# Patient Record
Sex: Female | Born: 2000 | Race: White | Hispanic: No | Marital: Single | Smoking: Never smoker
Health system: Southern US, Community
[De-identification: ages and names within clinical notes are randomized; demographics above are authoritative.]

## PROBLEM LIST (undated history)

## (undated) DIAGNOSIS — J329 Chronic sinusitis, unspecified: Secondary | ICD-10-CM

## (undated) DIAGNOSIS — J069 Acute upper respiratory infection, unspecified: Secondary | ICD-10-CM

## (undated) DIAGNOSIS — R000 Tachycardia, unspecified: Secondary | ICD-10-CM

## (undated) HISTORY — DX: Chronic sinusitis, unspecified: J32.9

## (undated) HISTORY — DX: Tachycardia, unspecified: R00.0

## (undated) HISTORY — PX: TONSILLECTOMY: SUR1361

## (undated) HISTORY — DX: Acute upper respiratory infection, unspecified: J06.9

---
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
## 2018-10-06 ENCOUNTER — Other Ambulatory Visit: Payer: Self-pay

## 2018-10-06 DIAGNOSIS — Z20822 Contact with and (suspected) exposure to covid-19: Secondary | ICD-10-CM

## 2018-10-08 LAB — NOVEL CORONAVIRUS, NAA: SARS-CoV-2, NAA: DETECTED — AB

## 2018-10-18 ENCOUNTER — Other Ambulatory Visit: Payer: Self-pay | Admitting: *Deleted

## 2018-10-18 DIAGNOSIS — Z20822 Contact with and (suspected) exposure to covid-19: Secondary | ICD-10-CM

## 2018-10-20 LAB — NOVEL CORONAVIRUS, NAA: SARS-CoV-2, NAA: NOT DETECTED

## 2019-02-26 ENCOUNTER — Other Ambulatory Visit: Payer: Self-pay

## 2019-02-26 ENCOUNTER — Ambulatory Visit: Payer: BLUE CROSS/BLUE SHIELD | Attending: Internal Medicine

## 2019-02-28 ENCOUNTER — Ambulatory Visit: Payer: BLUE CROSS/BLUE SHIELD | Attending: Internal Medicine

## 2019-02-28 DIAGNOSIS — Z20822 Contact with and (suspected) exposure to covid-19: Secondary | ICD-10-CM

## 2019-03-02 LAB — NOVEL CORONAVIRUS, NAA: SARS-CoV-2, NAA: NOT DETECTED

## 2021-06-02 ENCOUNTER — Telehealth: Payer: Self-pay

## 2021-06-02 NOTE — Telephone Encounter (Signed)
NOTES SCANNED TO REFERRAL 

## 2021-06-13 NOTE — Progress Notes (Deleted)
?Cardiology Office Note:   ? ?Date:  06/17/2021  ? ?ID:  Nandika Cordle, DOB [birth year], MRN [id number] ? ?PCP:  Eartha Inch, MD ?  ?[facility] Providers ?Cardiologist:  None { ? ?Referring MD: Julien Girt, PA*  ? ? ?History of Present Illness:   ? ?Jasmine Mckee is a 21 y.o. female with no significant past medical history who was referred by Julien Girt for further evaluation of symptomatic tachycardia. ? ?Today, *** ? ?Past Medical History:  ?Diagnosis Date  ? Sinusitis   ? Tachycardia   ? URI (upper respiratory infection)   ? ? ?*** The histories are not reviewed yet. Please review them in the "History" navigator section and refresh this SmartLink. ? ?Current Medications: ?Current Meds  ?Medication Sig  ? Norgestimate-Eth Estradiol (SPRINTEC 28 PO) Take by mouth.  ?  ? ?Allergies:   Patient has no known allergies.  ? ?Social History  ? ?Socioeconomic History  ? Marital status: Single  ?  Spouse name: Not on file  ? Number of children: Not on file  ? Years of education: Not on file  ? Highest education level: Not on file  ?Occupational History  ? Not on file  ?Tobacco Use  ? Smoking status: Never  ? Smokeless tobacco: Not on file  ?Substance and Sexual Activity  ? Alcohol use: Yes  ? Drug use: Not on file  ? Sexual activity: Not on file  ?Other Topics Concern  ? Not on file  ?Social History Narrative  ? Not on file  ? ?Social Determinants of Health  ? ?Financial Resource Strain: Not on file  ?Food Insecurity: Not on file  ?Transportation Needs: Not on file  ?Physical Activity: Not on file  ?Stress: Not on file  ?Social Connections: Not on file  ?  ? ?Family History: ?The patient's ***family history includes Clotting disorder in her maternal grandmother; High Cholesterol in her maternal grandmother and mother. ? ?ROS:   ?Please see the history of present illness.    ?*** All other systems reviewed and are negative. ? ?EKGs/Labs/Other Studies Reviewed:   ? ?The following studies were reviewed  today: ?*** ? ?EKG:  EKG is *** ordered today.  The ekg ordered today demonstrates *** ? ?Recent Labs: ?No results found for requested labs within last 8760 hours.  ?Recent Lipid Panel ?No results found for: CHOL, TRIG, HDL, CHOLHDL, VLDL, LDLCALC, LDLDIRECT ? ? ?Risk Assessment/Calculations:   ?{Does this patient have ATRIAL FIBRILLATION?:[phone]} ? ?    ? ?Physical Exam:   ? ?VS:  BP 118/78   Pulse 87   Ht 5\' 5"  (1.651 m)   Wt 151 lb 3.2 oz (68.6 kg)   SpO2 96%   BMI 25.16 kg/m?    ? ?Wt Readings from Last 3 Encounters:  ?06/17/21 151 lb 3.2 oz (68.6 kg)  ?  ? ?GEN: *** Well nourished, well developed in no acute distress ?HEENT: Normal ?NECK: No JVD; No carotid bruits ?LYMPHATICS: No lymphadenopathy ?CARDIAC: ***RRR, no murmurs, rubs, gallops ?RESPIRATORY:  Clear to auscultation without rales, wheezing or rhonchi  ?ABDOMEN: Soft, non-tender, non-distended ?MUSCULOSKELETAL:  No edema; No deformity  ?SKIN: Warm and dry ?NEUROLOGIC:  Alert and oriented x 3 ?PSYCHIATRIC:  Normal affect  ? ?ASSESSMENT:   ? ?No diagnosis found. ?PLAN:   ? ?In order of problems listed above: ? ?#Symptomatic Sinus Tachycardia: ?-Check zio monitor ?-Increase hydration ?-Cut back on caffeine ?-Check TSH ?-??? supplements ? ?   ? ?{Are you ordering a CV Procedure (  e.g. stress test, cath, DCCV, TEE, etc)?   Press F2        :[id number]}  ? ? ?Medication Adjustments/Labs and Tests Ordered: ?Current medicines are reviewed at length with the patient today.  Concerns regarding medicines are outlined above.  ?No orders of the defined types were placed in this encounter. ? ?No orders of the defined types were placed in this encounter. ? ? ?There are no Patient Instructions on file for this visit.  ? ?Signed, ?Meriam Sprague, MD  ?06/17/2021 3:10 PM    ?[facility] ?

## 2021-06-17 ENCOUNTER — Ambulatory Visit: Payer: BC Managed Care – PPO | Admitting: Cardiology

## 2021-06-17 ENCOUNTER — Encounter: Payer: Self-pay | Admitting: Cardiology

## 2021-06-17 VITALS — BP 118/78 | HR 87 | Ht 65.0 in | Wt 151.2 lb

## 2021-06-17 DIAGNOSIS — R002 Palpitations: Secondary | ICD-10-CM | POA: Diagnosis not present

## 2021-06-17 NOTE — Progress Notes (Signed)
?Cardiology Office Note:   ? ?Date:  06/17/2021  ? ?ID:  Jasmine Mckee, DOB 09/01/2000, MRN [id number] ? ?PCP:  Jasmine Inch, MD ?  ?[facility] Providers ?Cardiologist:  None { ? ?Referring MD: Jasmine Girt, PA*  ? ? ?History of Present Illness:   ? ?Jasmine Mckee is a 21 y.o. female with no significant past medical history who was referred by Jasmine Mckee for further evaluation of symptomatic tachycardia. ? ?Referral notes from [place], [place] reviewed. At her visit on 05/29/2021 she was noted to have a longstanding history of intermittent tachycardia. She previously wore a Holter monitor in high school and in college; she was told both monitors were normal. She reported that her tachycardia was becoming more symptomatic. She had recently participated in a 10k run and her HR was 193 bpm on average. Also she woke up with right-sided facial edema and new onset congestion. She had no dental pain. Had labs at that time with normal TSH and hemoglobin. ? ?Today, the patient states that she has been having intermittent tachycardia (average of 1 episode per day that lasts about 5 minutes) with shortness of breath and palpitations for the last several years. The episodes are not always associated with activity and may occur at rest, has been triggered by drinking alcohol as well. Does have easy fatigability with exercise due to the persistent tachycardia, she has never been able to run for sustained periods. Her heart rate during her 10K a few weeks ago was 193, when reviewing her smart watch data, her heart rate at the end of the race was 188bpm and was 207bpm 5 minutes later. It took her 45 minutes to recover after this run due to shortness of breath and her heart rate.   ?Denies any excessive caffeine use (only has about 3 diet Coke's per week). Denies OTC supplements, tobacco use, drug use. ?Only medication is Sprintec, which she has been on for the last year. She was previously on it several years ago  and then switched to an IUD for 2 years. The symptoms did not change during that time period.  ?Family cardiovascular disease with maternal grandmother requiring stents in her 73s and 42s. Mother and maternal grandmother have hyperlipidemia.  ? ? ?She denies any , chest pain, or peripheral edema. No lightheadedness, headaches, syncope, orthopnea, or PND. ? ? ?Past Medical History:  ?Diagnosis Date  ? Sinusitis   ? Tachycardia   ? URI (upper respiratory infection)   ? ? ? ? ?Current Medications: ?Current Meds  ?Medication Sig  ? Norgestimate-Eth Estradiol (SPRINTEC 28 PO) Take by mouth.  ?  ? ?Allergies:   Patient has no known allergies.  ? ?Social History  ? ?Socioeconomic History  ? Marital status: Single  ?  Spouse name: Not on file  ? Number of children: Not on file  ? Years of education: Not on file  ? Highest education level: Not on file  ?Occupational History  ? Not on file  ?Tobacco Use  ? Smoking status: Never  ? Smokeless tobacco: Not on file  ?Substance and Sexual Activity  ? Alcohol use: Yes  ? Drug use: Not on file  ? Sexual activity: Not on file  ?Other Topics Concern  ? Not on file  ?Social History Narrative  ? Not on file  ? ?Social Determinants of Health  ? ?Financial Resource Strain: Not on file  ?Food Insecurity: Not on file  ?Transportation Needs: Not on file  ?Physical Activity: Not on  file  ?Stress: Not on file  ?Social Connections: Not on file  ?  ? ?Family History: ?The patient's family history includes Clotting disorder in her maternal grandmother; High Cholesterol in her maternal grandmother and mother. ? ?ROS:   ?Review of Systems  ?Constitutional:  Negative for chills and fever.  ?HENT:  Negative for hearing loss and nosebleeds.   ?Eyes:  Negative for pain.  ?Respiratory:  Positive for shortness of breath. Negative for sputum production.   ?Cardiovascular:  Positive for palpitations. Negative for chest pain, orthopnea, claudication, leg swelling and PND.  ?Gastrointestinal:  Negative for  constipation and diarrhea.  ?Genitourinary:  Negative for dysuria.  ?Musculoskeletal:  Negative for joint pain.  ?Neurological:  Negative for seizures and loss of consciousness.  ?Endo/Heme/Allergies:  Does not bruise/bleed easily.  ?Psychiatric/Behavioral:  Negative for depression and substance abuse.   ? ? ?EKGs/Labs/Other Studies Reviewed:   ? ?The following studies were reviewed today: ? ?Pediatric TTE 04/12/2019 ([facility]): ?INTERPRETATION SUMMARY  ?  No cardiac disease identified.  ? ?CARDIAC POSITION  ?  Levocardia. Abdominal situs solitus. Atrial situs solitus. D Ventricular Loop. S Normal  ?  position great vessels.  ? ?VEINS  ?  Normal systemic venous connections. At least three pulmonary veins are confirmed  ?  connecting into the left atrium. Normal pulmonary vein velocity.  ? ?ATRIA  ?  Normal right atrial size. Normal left atrial size. No atrial level communication seen.  ? ?ATRIOVENTRICULAR VALVES  ?  Normal tricuspid valve. No tricuspid valve stenosis. Mild tricuspid valve regurgitation.  ?  The TR peak gradient is at least 22 mmHg, but is obtained from an incomplete tracing.  ?  Normal mitral valve. No mitral valve stenosis. Trace mitral valve regurgitation.  ? ?VENTRICLES  ?  Normal right ventricle structure and size. Normal left ventricle structure and size.  ?  Intact ventricular septum.  ? ?CARDIAC FUNCTION  ?  Normal right ventricular systolic function. Normal left ventricular systolic function.  ? ?SEMILUNAR VALVES  ?  Normal pulmonic valve. Trivial pulmonary valve insufficiency. Normal pulmonic valve  ?  velocity. Aortic valve mobility appears normal. Tricommissural aortic valve. Normal  ?  aortic valve velocity by Doppler. No aortic valve insufficiency by color Doppler.  ? ?CORONARY ARTERIES  ?  The right coronary artery appears to arise normally by two-dimensional imaging, but is  ?  not confirmed by color Doppler. Normal origin and proximal course of the left coronary  ?  artery with prograde  flow demonstrated by color Doppler.  ? ?GREAT ARTERIES  ?  Left aortic arch with normal branching pattern. No evidence of coarctation of the aorta.  ?  No aortic root dilation. There is normal pulsatility of the abdominal aorta. Normal main  ?  pulmonary artery and pulmonary artery branches.  ? ?SHUNTS  ?  No patent ductus arteriosus.  ? ?EXTRACARDIAC  ?  No pericardial effusion. There is no pleural effusion.  ? ? ?EKG:  EKG is personally reviewed. ?06/17/2021: Sinus rhythm. Rate 87 bpm. ? ? ?Recent Labs: ?No results found for requested labs within last 8760 hours.  ? ?Recent Lipid Panel ?No results found for: CHOL, TRIG, HDL, CHOLHDL, VLDL, LDLCALC, LDLDIRECT ? ? ?Risk Assessment/Calculations:   ?  ? ?    ? ?Physical Exam:   ? ?VS:  BP 118/78   Pulse 87   Ht 5\' 5"  (1.651 m)   Wt 151 lb 3.2 oz (68.6 kg)   SpO2 96%   BMI  25.16 kg/m?    ? ?Wt Readings from Last 3 Encounters:  ?06/17/21 151 lb 3.2 oz (68.6 kg)  ?  ? ?GEN: Well nourished, well developed in no acute distress ?HEENT: Normal ?NECK: No JVD; No carotid bruits ?CARDIAC: RRR, no murmurs, rubs, gallops ?RESPIRATORY:  Clear to auscultation without rales, wheezing or rhonchi  ?ABDOMEN: Soft, non-tender, non-distended ?MUSCULOSKELETAL:  No edema; No deformity  ?SKIN: Warm and dry ?NEUROLOGIC:  Alert and oriented x 3 ?PSYCHIATRIC:  Normal affect  ? ?ASSESSMENT:   ? ?1. Palpitations   ? ?PLAN:   ? ?In order of problems listed above: ? ?#Symptomatic Sinus Tachycardia: ?Thyroid and hemoglobin levels recently normal. Consideration for inappropriate sinus tachycardia. Given significant symptoms with exercise, will obtain an exercise treadmill test to evaluate and monitor HR. Unlikely an obstructive etiology given age and lack of risk factors. Anxiety does not appear to be a contribution to the symptoms. ?-Exercise treadmill test ?-Declined repeat monitor as has had multiple in the past ?-Can consider BB pending GXT findings ?-Increase hydration ?-Closely monitor HR  trend with apple watch ? ?   ? ?Shared Decision Making/Informed Consent{ ? ?The risks [chest pain, shortness of breath, cardiac arrhythmias, dizziness, blood pressure fluctuations, myocardial infarction, strok

## 2021-06-17 NOTE — Patient Instructions (Signed)
Medication Instructions:  ? ?Your physician recommends that you continue on your current medications as directed. Please refer to the Current Medication list given to you today. ? ?*If you need a refill on your cardiac medications before your next appointment, please call your pharmacy* ? ? ?Testing/Procedures: ? ?Your physician has requested that you have an exercise tolerance test. For further information please visit [email or web address] Please also follow instruction sheet, as given. ? ? ?Follow-Up: ? ?3 MONTHS WITH AN EXTENDER IN THE OFFICE ? ? ?Important Information About Sugar ? ? ? ? ? ? ?

## 2021-07-02 ENCOUNTER — Ambulatory Visit (INDEPENDENT_AMBULATORY_CARE_PROVIDER_SITE_OTHER): Payer: BC Managed Care – PPO

## 2021-07-02 ENCOUNTER — Telehealth: Payer: Self-pay | Admitting: *Deleted

## 2021-07-02 ENCOUNTER — Telehealth: Payer: Self-pay | Admitting: Cardiology

## 2021-07-02 DIAGNOSIS — R002 Palpitations: Secondary | ICD-10-CM

## 2021-07-02 LAB — EXERCISE TOLERANCE TEST
Angina Index: 0
Duke Treadmill Score: 10
Estimated workload: 12.2
Exercise duration (min): 10 min
Exercise duration (sec): 13 s
MPHR: 198 {beats}/min
Peak HR: 196 {beats}/min
Percent HR: 98 %
RPE: 17
Rest HR: 96 {beats}/min
ST Depression (mm): 0 mm

## 2021-07-02 MED ORDER — METOPROLOL TARTRATE 25 MG PO TABS: 12.5000 mg | ORAL_TABLET | Freq: Two times a day (BID) | ORAL | 2 refills | Status: DC

## 2021-07-02 NOTE — Telephone Encounter (Signed)
Pt just left the office from her GXT appt.  Dr. Shari Prows ordered this on the pt at the last OV for inappropriate sinus tach.   Pt stopped by checkout on the way out of the office and asked for them to send Dr. Shari Prows and myself a message about her starting a beta blocker as discussed at last OV.   Pt states she is asymptomatic at this time, but still infrequently experiences palpitations.  Pt states she held off on starting a BB because she wanted to consult this with her parents first and let her mom do more research.  Also pt stated her Mom preferred less meds as possible at the time pt was seen at the office.  Pt states with great consideration, both her and her parents agree that she should start a BB as discussed.   Informed the pt that Dr. Shari Prows wanted to advise on starting a BB once she had her GXT completed.  Pt voiced the majority of her complaints did occur when she exercised in the past.  Pt is a runner. Endorsed to the pt that Dr. Shari Prows was going to consider starting her on a BB pending GXT findings, being pt had multiple monitors in the past and refused to have another, when offered at last OV.  Did advise the pt to increase her po hydration.  Did advise her to continue monitoring HR trends on her smart watch.  Informed the pt that once Dr. Shari Prows has her GXT results back, she will advise on this and we will follow-up with her accordingly thereafter with further recommendations, and if a BB needs to be started at that time.   Will route this message to Dr. Shari Prows to further refer to and advise, when GXT report comes back. Pt verbalized understanding and agrees with this plan.

## 2021-07-02 NOTE — Telephone Encounter (Signed)
Patient came in today for GXT and stated she wanted to let Dr. Shari Prows know that she would like to start the beta blocker they talked about on the previous visit.(06/17/21) Please give her a call to discuss at [phone].

## 2021-07-02 NOTE — Telephone Encounter (Signed)
See previous open telephone encounter regarding this result.

## 2021-07-02 NOTE — Telephone Encounter (Signed)
-----   Message from Jasmine Sprague, MD sent at 07/02/2021  4:13 PM EDT ----- Her exercise stress test shows that she is in normal rhythm throughout her test. She does go very fast up to 190 but she is in rhythm. During recovery, her heart rate slows down to 120 before the test was over. Overall, this is good news because she has no evidence of arrhythmias. If she was having symptoms during testing, however, she may benefit from a little beta blocker to help prevent her from jumping up so high with exertion like we discussed during her visit. Again, this would be to make her feel better as there is nothing harmful seen on this test. Let us know what she would like to do.

## 2021-07-02 NOTE — Addendum Note (Signed)
Addended by: Loa Socks on: 07/02/2021 05:16 PM   Modules accepted: Orders

## 2021-07-02 NOTE — Telephone Encounter (Signed)
Attempted to call the pt back to endorse GXT results and recommendations per Dr. Shari Prows, and pt did not answer and voicemail is full at this time.

## 2021-07-02 NOTE — Telephone Encounter (Signed)
The patient has been notified of the result and verbalized understanding.  All questions (if any) were answered.  Pt aware that per Dr. Shari Prows, we will start her on metoprolol tartrate 12.5 mg po bid, and she should take her first dose at night and see how she feels in the morning.  She is aware this is a short acting med so if she does not feel well on it, it washes out of the system within 8-12 hrs.  She is aware that if she tolerates the medication, then we can change her to the long-acting form which is only taken once a day.  Confirmed the pharmacy of choice with the pt. Pt verbalized understanding and agrees with this plan.

## 2021-07-02 NOTE — Telephone Encounter (Signed)
Jasmine Sprague, MD  Loa Socks, LPN Her exercise stress test shows that she is in normal rhythm throughout her test. She does go very fast up to 190 but she is in rhythm. During recovery, her heart rate slows down to 120 before the test was over. Overall, this is good news because she has no evidence of arrhythmias. If she was having symptoms during testing, however, she may benefit from a little beta blocker to help prevent her from jumping up so high with exertion like we discussed during her visit. Again, this would be to make her feel better as there is nothing harmful seen on this test. Let us know what she would like to do.

## 2021-07-02 NOTE — Telephone Encounter (Signed)
I sent a result message about this too so she read my mind.  Let's trial low dose metoprolol tartrate 12.5mg  BID. Have her take her first dose at night and see how she feels in the morning. This is a short acting med so if she does not feel well on it, it washes out within 8-12 hours. If she tolerates the medication, then we can change her to the long-acting form which is a once a day medication.

## 2021-07-19 ENCOUNTER — Encounter: Payer: Self-pay | Admitting: Cardiology

## 2021-07-20 MED ORDER — METOPROLOL TARTRATE 25 MG PO TABS: 25.0000 mg | ORAL_TABLET | Freq: Two times a day (BID) | ORAL | 1 refills | Status: DC

## 2021-08-25 ENCOUNTER — Encounter: Payer: Self-pay | Admitting: Cardiology

## 2021-08-25 MED ORDER — METOPROLOL TARTRATE 25 MG PO TABS: 25.0000 mg | ORAL_TABLET | Freq: Two times a day (BID) | ORAL | 1 refills | Status: DC

## 2021-09-17 ENCOUNTER — Ambulatory Visit: Payer: BC Managed Care – PPO | Admitting: Nurse Practitioner

## 2021-09-17 NOTE — Progress Notes (Deleted)
Cardiology Office Note:    Date:  09/17/2021   ID:  Jasmine Mckee, DOB 11-21-2000, MRN [id number]  PCP:  Eartha Inch, MD   [facility] Providers Cardiologist:  None { Click to update primary MD,subspecialty MD or APP then REFRESH:1}    Referring MD: Eartha Inch, MD   Chief Complaint: ***  History of Present Illness:    Jasmine Mckee is a *** 21 y.o. female with a hx of ***  Referred for symptomatic tachycardia and seen by Dr. Shari Prows on 06/17/2021  Past Medical History:  Diagnosis Date   Sinusitis    Tachycardia    URI (upper respiratory infection)     *** The histories are not reviewed yet. Please review them in the "History" navigator section and refresh this SmartLink.  Current Medications: No outpatient medications have been marked as taking for the 09/17/21 encounter (Appointment) with Lissa Hoard, Zachary George, NP.     Allergies:   Patient has no known allergies.   Social History   Socioeconomic History   Marital status: Single    Spouse name: Not on file   Number of children: Not on file   Years of education: Not on file   Highest education level: Not on file  Occupational History   Not on file  Tobacco Use   Smoking status: Never   Smokeless tobacco: Not on file  Substance and Sexual Activity   Alcohol use: Yes   Drug use: Not on file   Sexual activity: Not on file  Other Topics Concern   Not on file  Social History Narrative   Not on file   Social Determinants of Health   Financial Resource Strain: Not on file  Food Insecurity: Not on file  Transportation Needs: Not on file  Physical Activity: Not on file  Stress: Not on file  Social Connections: Not on file     Family History: The patient's ***family history includes Clotting disorder in her maternal grandmother; High Cholesterol in her maternal grandmother and mother.  ROS:   Please see the history of present illness.    *** All other systems reviewed and are negative.  Labs/Other  Studies Reviewed:    The following studies were reviewed today: ***  Recent Labs: No results found for requested labs within last 365 days.  Recent Lipid Panel No results found for: "CHOL", "TRIG", "HDL", "CHOLHDL", "VLDL", "LDLCALC", "LDLDIRECT"   Risk Assessment/Calculations:   {Does this patient have ATRIAL FIBRILLATION?:[phone]}       Physical Exam:    VS:  There were no vitals taken for this visit.    Wt Readings from Last 3 Encounters:  06/17/21 151 lb 3.2 oz (68.6 kg)     GEN: *** Well nourished, well developed in no acute distress HEENT: Normal NECK: No JVD; No carotid bruits CARDIAC: ***RRR, no murmurs, rubs, gallops RESPIRATORY:  Clear to auscultation without rales, wheezing or rhonchi  ABDOMEN: Soft, non-tender, non-distended MUSCULOSKELETAL:  No edema; No deformity. *** pedal pulses, ***bilaterally SKIN: Warm and dry NEUROLOGIC:  Alert and oriented x 3 PSYCHIATRIC:  Normal affect   EKG:  EKG is *** ordered today.  The ekg ordered today demonstrates ***  Diagnoses:    No diagnosis found. Assessment and Plan:       {Are you ordering a CV Procedure (e.g. stress test, cath, DCCV, TEE, etc)?   Press F2        :[id number]}    Medication Adjustments/Labs and Tests Ordered: Current medicines are reviewed  at length with the patient today.  Concerns regarding medicines are outlined above.  No orders of the defined types were placed in this encounter.  No orders of the defined types were placed in this encounter.   There are no Patient Instructions on file for this visit.   Signed, Levi Aland, NP  09/17/2021 6:40 AM    [facility]

## 2021-09-28 NOTE — Progress Notes (Deleted)
Cardiology Office Note:    Date:  09/28/2021   ID:  Jasmine Mckee, DOB 08-21-2000, MRN [id number]  PCP:  Jasmine Inch, MD   [facility] Providers Cardiologist:  None { Click to update primary MD,subspecialty MD or APP then REFRESH:1}    Referring MD: Jasmine Inch, MD   Chief Complaint: ***  History of Present Illness:    Jasmine Mckee is a *** 21 y.o. female with no significant past medical history referred to cardiology by PCP for evaluation of symptomatic tachycardia.   Seen by Dr. Shari Mckee on 06/17/2021 for longstanding history of intermittent tachycardia.  Previously wore a Holter monitor in high school and college and both monitors were normal.  She reported her tachycardia was becoming more symptomatic.  She had recently participated in a 10K run and HR was 193 bpm on average.  Awoke with right-sided facial edema and new onset congestion TSH and hemoglobin were normal. Reported average of 1 episode per day lasting 5 minutes of tachycardia with shortness of breath and palpitations for the last several years.  Not always associated with activity and may occur at rest.  Has been triggered by drinking alcohol as well has easy fatigability with exercise due to persistent tachycardia, has never been able to run for sustained periods.  Following her run HR was 188 bpm at end of race and was 207 bpm 5 minutes later.  It took her 45 minutes to recover.  Denies excessive caffeine use (only has about 3 diet Cokes per week).  Denies OTC supplements, tobacco use, drug use. Has been on oral BC, tried an IUD as well with no improvement in symptoms.   ETT revealed NSR throughout the test, up to 190 bpm. During recovery char slows down to 120 bpm before test completed.  No evidence of arrhythmias.  She was started on metoprolol tartrate 12.5 mg daily for symptom relief.  She continued to have symptoms so dose was increased to 25 mg twice daily.  Today, she is here for follow-up.     Past Medical  History:  Diagnosis Date   Sinusitis    Tachycardia    URI (upper respiratory infection)     *** The histories are not reviewed yet. Please review them in the "History" navigator section and refresh this SmartLink.  Current Medications: No outpatient medications have been marked as taking for the 09/30/21 encounter (Appointment) with Jasmine Mckee, Jasmine George, NP.     Allergies:   Patient has no known allergies.   Social History   Socioeconomic History   Marital status: Single    Spouse name: Not on file   Number of children: Not on file   Years of education: Not on file   Highest education level: Not on file  Occupational History   Not on file  Tobacco Use   Smoking status: Never   Smokeless tobacco: Not on file  Substance and Sexual Activity   Alcohol use: Yes   Drug use: Not on file   Sexual activity: Not on file  Other Topics Concern   Not on file  Social History Narrative   Not on file   Social Determinants of Health   Financial Resource Strain: Not on file  Food Insecurity: Not on file  Transportation Needs: Not on file  Physical Activity: Not on file  Stress: Not on file  Social Connections: Not on file     Family History: The patient's ***family history includes Clotting disorder in her maternal grandmother; High  Cholesterol in her maternal grandmother and mother.  ROS:   Please see the history of present illness.    *** All other systems reviewed and are negative.  Labs/Other Studies Reviewed:    The following studies were reviewed today:  ETT 07/02/21    No ST deviation was noted.   ETT with good exercise tolerance (10:13); no chest pain; normal blood pressure response; no exercise-induced arrhythmias noted; no ST changes; negative adequate exercise treadmill.   Recent Labs: No results found for requested labs within last 365 days.  Recent Lipid Panel No results found for: "CHOL", "TRIG", "HDL", "CHOLHDL", "VLDL", "LDLCALC", "LDLDIRECT"   Risk  Assessment/Calculations:   {Does this patient have ATRIAL FIBRILLATION?[phone]}       Physical Exam:    VS:  There were no vitals taken for this visit.    Wt Readings from Last 3 Encounters:  06/17/21 151 lb 3.2 oz (68.6 kg)     GEN: *** Well nourished, well developed in no acute distress HEENT: Normal NECK: No JVD; No carotid bruits CARDIAC: ***RRR, no murmurs, rubs, gallops RESPIRATORY:  Clear to auscultation without rales, wheezing or rhonchi  ABDOMEN: Soft, non-tender, non-distended MUSCULOSKELETAL:  No edema; No deformity. *** pedal pulses, ***bilaterally SKIN: Warm and dry NEUROLOGIC:  Alert and oriented x 3 PSYCHIATRIC:  Normal affect   EKG:  EKG is *** ordered today.  The ekg ordered today demonstrates ***  Diagnoses:    No diagnosis found. Assessment and Plan:     Palpitations:  {Are you ordering a CV Procedure (e.g. stress test, cath, DCCV, TEE, etc)?   Press F2        :[id number]}    Medication Adjustments/Labs and Tests Ordered: Current medicines are reviewed at length with the patient today.  Concerns regarding medicines are outlined above.  No orders of the defined types were placed in this encounter.  No orders of the defined types were placed in this encounter.   There are no Patient Instructions on file for this visit.   Signed, Levi Aland, NP  09/28/2021 8:10 AM    [facility]

## 2021-09-30 ENCOUNTER — Ambulatory Visit: Payer: BC Managed Care – PPO | Admitting: Nurse Practitioner

## 2021-10-03 ENCOUNTER — Encounter: Payer: Self-pay | Admitting: Cardiology

## 2023-06-22 NOTE — Progress Notes (Unsigned)
 Cardiology Office Note:   Date:  06/23/2023  ID:  Jasmine Mckee, DOB July 18, 2000, MRN [id number] PCP:  Emaline Handsome, MD  [facility] Providers Cardiologist:  Alyssa Backbone, MD Referring MD: Emaline Handsome, MD  Chief Complaint/Reason for Referral:  F/U tachycardia ASSESSMENT:    1. Palpitations   2. Precordial pain     PLAN:   In order of problems listed above: Palpitations: Have resolved after 30 pound weight loss. Chest pain: Seems atypical.  Happens at rest.  She is very active and it does not happen with exertion.  She had a very reassuring exercise treadmill stress test in 2023 achieving 12 METS.  She gives me a history of quite frequent burping and belching.  We will trial a PPI.  I discussed with the patient that we will keep follow-up open-ended for now.  She agrees with this.            Dispo:  Return if symptoms worsen or fail to improve.      Medication Adjustments/Labs and Tests Ordered: Current medicines are reviewed at length with the patient today.  Concerns regarding medicines are outlined above.  The following changes have been made:     Labs/tests ordered: Orders Placed This Encounter  Procedures   EKG 12-Lead    Medication Changes: Meds ordered this encounter  Medications   omeprazole (PRILOSEC) 20 MG capsule    Sig: Take 1 capsule (20 mg total) by mouth daily.    Dispense:  30 capsule    Refill:  0    Can get OTC after this 30 day script    Current medicines are reviewed at length with the patient today.  The patient does not have concerns regarding medicines.  I spent 33 minutes reviewing all clinical data during and prior to this visit including all relevant imaging studies, laboratories, clinical information from other health systems and prior notes from both Cardiology and other specialties, interviewing the patient, conducting a complete physical examination, and coordinating care in order to formulate a comprehensive and personalized  evaluation and treatment plan.   History of Present Illness:      FOCUSED PROBLEM LIST:   Palpitations Multiple previous monitors >> no abnormalies ETT negative 2023 EF normal,  no valve issues TTE 2021 ([facility])  May 2025:  Patient consents to use of AI scribe. The patient returns for follow-up.  She was last seen a few years ago for tachycardia.  She has had an extensive workup with multiple monitors in high school and college which were essentially normal.  Apparently she participated in a 10K run and her average heart rate was 193.  TSH and and CBC were normal during that evaluation.  At that last visit she reported intermittent tachycardia leading to fatigue.  She denied any supplements, excessive caffeine use, tobacco use, or drug use.  She was offered a monitor which she declined due to multiple other monitors being reassuring.  She is referred for an exercise treadmill stress test which again was reassuring.  Due to a highly elevated heart rate during exercise, Lopressor  25 mg twice daily was prescribed.  She has a history of high heart rate and was previously on metoprolol , which she is no longer taking. Her heart rate is currently stable, which she attributes to a 30-pound weight loss. Her mother notes that her heart rate used to reach up to 220 beats per minute during high school, prompting initial cardiology evaluation. No recent episodes of tachycardia have been  reported.  She began experiencing severe chest pain two weeks ago, described as a sensation of someone sitting on her chest. Initially mild, the pain became severe two nights ago while she was sitting on the couch, making it difficult to breathe. Attempts to alleviate the pain by changing positions and pressing on her chest were unsuccessful. The pain subsided on its own but left her feeling 'bruised'. The chest pain has occurred twice, with the first episode occurring a week prior to the most recent one. No chest pain during  exercise, lightheadedness, leg swelling, or breathing difficulties when lying flat. She engages in walking and pushups for exercise.  She reports frequent indigestion and excessive belching with every meal. She is not currently taking any medication for indigestion.        Current Medications: Current Meds  Medication Sig   albuterol (VENTOLIN HFA) 108 (90 Base) MCG/ACT inhaler Inhale 2 puffs into the lungs.   norelgestromin-ethinyl estradiol (XULANE) 150-35 MCG/24HR transdermal patch 1 patch once a week.   omeprazole (PRILOSEC) 20 MG capsule Take 1 capsule (20 mg total) by mouth daily.   sertraline (ZOLOFT) 50 MG tablet Take 1 tablet by mouth daily.   TREMFYA 100 MG/ML pen    tretinoin (RETIN-A) 0.05 % cream Apply topically.     Review of Systems:   Please see the history of present illness.    All other systems reviewed and are negative.     EKGs/Labs/Other Test Reviewed:   EKG: 2023 EKG normal sinus rhythm  EKG Interpretation Date/Time:  Thursday Jun 23 2023 09:01:44 EDT Ventricular Rate:  92 PR Interval:  122 QRS Duration:  68 QT Interval:  346 QTC Calculation: 427 R Axis:   75  Text Interpretation: Normal sinus rhythm Normal ECG No previous ECGs available Confirmed by Alyssa Backbone ([id number]) on 06/23/2023 9:08:31 AM         Risk Assessment/Calculations:          Physical Exam:   VS:  BP 118/78   Pulse 73   Ht 5\' 5"  (1.651 m)   Wt 122 lb 3.2 oz (55.4 kg)   SpO2 94%   BMI 20.34 kg/m        Wt Readings from Last 3 Encounters:  06/23/23 122 lb 3.2 oz (55.4 kg)  06/17/21 151 lb 3.2 oz (68.6 kg)      GENERAL:  No apparent distress, AOx3 HEENT:  No carotid bruits, +2 carotid impulses, no scleral icterus CAR: RRR no murmurs, gallops, rubs, or thrills RES:  Clear to auscultation bilaterally ABD:  Soft, nontender, nondistended, positive bowel sounds x 4 VASC:  +2 radial pulses, +2 carotid pulses NEURO:  CN 2-12 grossly intact; motor and sensory grossly  intact PSYCH:  No active depression or anxiety EXT:  No edema, ecchymosis, or cyanosis  Signed, Aurorah Schlachter K Bryton Romagnoli, MD  06/23/2023 9:26 AM    [facility] [address] [place], [place], [place]  [address] Phone: [phone]; Fax: [phone]   Note:  This document was prepared using Dragon voice recognition software and may include unintentional dictation errors.

## 2023-06-23 ENCOUNTER — Ambulatory Visit: Attending: Internal Medicine | Admitting: Internal Medicine

## 2023-06-23 ENCOUNTER — Encounter: Payer: Self-pay | Admitting: Internal Medicine

## 2023-06-23 VITALS — BP 118/78 | HR 73 | Ht 65.0 in | Wt 122.2 lb

## 2023-06-23 DIAGNOSIS — R072 Precordial pain: Secondary | ICD-10-CM

## 2023-06-23 DIAGNOSIS — R002 Palpitations: Secondary | ICD-10-CM | POA: Diagnosis not present

## 2023-06-23 MED ORDER — OMEPRAZOLE 20 MG PO CPDR: 20.0000 mg | DELAYED_RELEASE_CAPSULE | Freq: Every day | ORAL | 0 refills | Status: AC

## 2023-06-23 NOTE — Patient Instructions (Signed)
 Medication Instructions:  Your physician has recommended you make the following change in your medication:  1.) start Prilosec (omeprazole) 20 mg - one tablet daily - a 30 day prescription has been sent to [facility].  After that, medication can be purchased over the counter.  *If you need a refill on your cardiac medications before your next appointment, please call your pharmacy*  Lab Work: none If you have labs (blood work) drawn today and your tests are completely normal, you will receive your results only by: MyChart Message (if you have MyChart) OR A paper copy in the mail If you have any lab test that is abnormal or we need to change your treatment, we will call you to review the results.  Testing/Procedures: none  Follow-Up: At [facility], you and your health needs are our priority.  As part of our continuing mission to provide you with exceptional heart care, our providers are all part of one team.  This team includes your primary Cardiologist (physician) and Advanced Practice Providers or APPs (Physician Assistants and Nurse Practitioners) who all work together to provide you with the care you need, when you need it.  Your next appointment:   As needed
